# Patient Record
Sex: Male | Born: 1965 | Race: Black or African American | Hispanic: No | Smoking: Current every day smoker
Health system: Southern US, Community
[De-identification: ages and names within clinical notes are randomized; demographics above are authoritative.]

## PROBLEM LIST (undated history)

## (undated) DIAGNOSIS — E119 Type 2 diabetes mellitus without complications: Secondary | ICD-10-CM

## (undated) DIAGNOSIS — I82409 Acute embolism and thrombosis of unspecified deep veins of unspecified lower extremity: Secondary | ICD-10-CM

## (undated) DIAGNOSIS — T79A0XA Compartment syndrome, unspecified, initial encounter: Secondary | ICD-10-CM

## (undated) DIAGNOSIS — Z7901 Long term (current) use of anticoagulants: Secondary | ICD-10-CM

## (undated) HISTORY — PX: FASCIOTOMY: SHX132

---

## 2018-09-20 ENCOUNTER — Emergency Department (HOSPITAL_COMMUNITY)
Admission: EM | Admit: 2018-09-20 | Discharge: 2018-09-20 | Disposition: A | Discharge status: Home / Self Care | Payer: Self-pay | Attending: Emergency Medicine | Admitting: Emergency Medicine

## 2018-09-20 ENCOUNTER — Other Ambulatory Visit: Payer: Self-pay

## 2018-09-20 ENCOUNTER — Emergency Department (HOSPITAL_COMMUNITY): Payer: Self-pay

## 2018-09-20 ENCOUNTER — Encounter (HOSPITAL_COMMUNITY): Payer: Self-pay | Admitting: Emergency Medicine

## 2018-09-20 DIAGNOSIS — R739 Hyperglycemia, unspecified: Secondary | ICD-10-CM

## 2018-09-20 DIAGNOSIS — T79A0XA Compartment syndrome, unspecified, initial encounter: Secondary | ICD-10-CM | POA: Insufficient documentation

## 2018-09-20 DIAGNOSIS — Z794 Long term (current) use of insulin: Secondary | ICD-10-CM | POA: Insufficient documentation

## 2018-09-20 DIAGNOSIS — K449 Diaphragmatic hernia without obstruction or gangrene: Secondary | ICD-10-CM | POA: Insufficient documentation

## 2018-09-20 DIAGNOSIS — F17210 Nicotine dependence, cigarettes, uncomplicated: Secondary | ICD-10-CM | POA: Insufficient documentation

## 2018-09-20 DIAGNOSIS — Z7901 Long term (current) use of anticoagulants: Secondary | ICD-10-CM | POA: Insufficient documentation

## 2018-09-20 DIAGNOSIS — R0789 Other chest pain: Secondary | ICD-10-CM | POA: Insufficient documentation

## 2018-09-20 DIAGNOSIS — R791 Abnormal coagulation profile: Secondary | ICD-10-CM | POA: Insufficient documentation

## 2018-09-20 DIAGNOSIS — R112 Nausea with vomiting, unspecified: Secondary | ICD-10-CM | POA: Insufficient documentation

## 2018-09-20 DIAGNOSIS — E1165 Type 2 diabetes mellitus with hyperglycemia: Secondary | ICD-10-CM | POA: Insufficient documentation

## 2018-09-20 DIAGNOSIS — Z86718 Personal history of other venous thrombosis and embolism: Secondary | ICD-10-CM | POA: Insufficient documentation

## 2018-09-20 DIAGNOSIS — R10816 Epigastric abdominal tenderness: Secondary | ICD-10-CM | POA: Insufficient documentation

## 2018-09-20 HISTORY — DX: Long term (current) use of anticoagulants: Z79.01

## 2018-09-20 HISTORY — DX: Acute embolism and thrombosis of unspecified deep veins of unspecified lower extremity: I82.409

## 2018-09-20 HISTORY — DX: Compartment syndrome, unspecified, initial encounter: T79.A0XA

## 2018-09-20 HISTORY — DX: Type 2 diabetes mellitus without complications: E11.9

## 2018-09-20 LAB — CBC WITH DIFFERENTIAL/PLATELET
Abs Immature Granulocytes: 0.03 10*3/uL (ref 0.00–0.07)
Basophils Absolute: 0.1 10*3/uL (ref 0.0–0.1)
Basophils Relative: 0 %
EOS PCT: 1 %
Eosinophils Absolute: 0.1 10*3/uL (ref 0.0–0.5)
HCT: 44.7 % (ref 39.0–52.0)
Hemoglobin: 14.3 g/dL (ref 13.0–17.0)
Immature Granulocytes: 0 %
Lymphocytes Relative: 14 %
Lymphs Abs: 2 10*3/uL (ref 0.7–4.0)
MCH: 27.7 pg (ref 26.0–34.0)
MCHC: 32 g/dL (ref 30.0–36.0)
MCV: 86.6 fL (ref 80.0–100.0)
Monocytes Absolute: 0.8 10*3/uL (ref 0.1–1.0)
Monocytes Relative: 6 %
Neutro Abs: 11 10*3/uL — ABNORMAL HIGH (ref 1.7–7.7)
Neutrophils Relative %: 79 %
Platelets: 384 10*3/uL (ref 150–400)
RBC: 5.16 MIL/uL (ref 4.22–5.81)
RDW: 12.9 % (ref 11.5–15.5)
WBC: 14 10*3/uL — AB (ref 4.0–10.5)
nRBC: 0 % (ref 0.0–0.2)

## 2018-09-20 LAB — COMPREHENSIVE METABOLIC PANEL
ALK PHOS: 73 U/L (ref 38–126)
ALT: 14 U/L (ref 0–44)
AST: 12 U/L — ABNORMAL LOW (ref 15–41)
Albumin: 3.8 g/dL (ref 3.5–5.0)
Anion gap: 12 (ref 5–15)
BILIRUBIN TOTAL: 0.6 mg/dL (ref 0.3–1.2)
BUN: 17 mg/dL (ref 6–20)
CO2: 27 mmol/L (ref 22–32)
Calcium: 9.4 mg/dL (ref 8.9–10.3)
Chloride: 98 mmol/L (ref 98–111)
Creatinine, Ser: 0.92 mg/dL (ref 0.61–1.24)
GFR calc Af Amer: >60 mL/min (ref >60)
GFR calc non Af Amer: >60 mL/min (ref >60)
Glucose, Bld: 285 mg/dL — ABNORMAL HIGH (ref 70–99)
Potassium: 4.1 mmol/L (ref 3.5–5.1)
Sodium: 137 mmol/L (ref 135–145)
TOTAL PROTEIN: 7.1 g/dL (ref 6.5–8.1)

## 2018-09-20 LAB — PROTIME-INR
INR: 1
Prothrombin Time: 13.1 seconds (ref 11.4–15.2)

## 2018-09-20 LAB — LIPASE, BLOOD: Lipase: 30 U/L (ref 11–51)

## 2018-09-20 LAB — CBG MONITORING, ED: Glucose-Capillary: 248 mg/dL — ABNORMAL HIGH (ref 70–99)

## 2018-09-20 LAB — I-STAT TROPONIN, ED
Troponin i, poc: 0 ng/mL (ref 0.00–0.08)
Troponin i, poc: 0 ng/mL (ref 0.00–0.08)

## 2018-09-20 MED ORDER — WARFARIN SODIUM 6 MG PO TABS: 6.0000 mg | ORAL_TABLET | Freq: Every day | ORAL | 0 refills | Status: AC

## 2018-09-20 MED ORDER — ONDANSETRON HCL 4 MG/2ML IJ SOLN
4.0000 mg | Freq: Once | INTRAMUSCULAR | Status: AC
  Administered 2018-09-20: 4 mg via INTRAVENOUS
  Filled 2018-09-20: qty 2

## 2018-09-20 MED ORDER — KETOROLAC TROMETHAMINE 30 MG/ML IJ SOLN
30.0000 mg | Freq: Once | INTRAMUSCULAR | Status: AC
  Administered 2018-09-20: 30 mg via INTRAVENOUS
  Filled 2018-09-20: qty 1

## 2018-09-20 MED ORDER — OMEPRAZOLE 20 MG PO CPDR: 20.0000 mg | DELAYED_RELEASE_CAPSULE | Freq: Every day | ORAL | 0 refills | Status: AC

## 2018-09-20 MED ORDER — ONDANSETRON 4 MG PO TBDP: 4.0000 mg | ORAL_TABLET | Freq: Three times a day (TID) | ORAL | 0 refills | Status: DC | PRN

## 2018-09-20 MED ORDER — INSULIN DETEMIR 100 UNIT/ML FLEXPEN: 25.0000 [IU] | PEN_INJECTOR | Freq: Every day | SUBCUTANEOUS | 0 refills | Status: AC

## 2018-09-20 MED ORDER — IOPAMIDOL (ISOVUE-370) INJECTION 76%
INTRAVENOUS | Status: AC
  Administered 2018-09-20: 100 mL
  Filled 2018-09-20: qty 100

## 2018-09-20 NOTE — ED Notes (Signed)
Patient verbalizes understanding of discharge instructions. Opportunity for questioning and answers were provided. Armband removed by staff, pt discharged from ED by wheelchair   

## 2018-09-20 NOTE — ED Provider Notes (Signed)
[facility] EMERGENCY DEPARTMENT Provider Note   CSN: [id number] Arrival date & time: 09/20/18  1445     History   Chief Complaint Chief Complaint  Patient presents with  . Chest Pain    HPI Phillip Avila is a 53 y.o. 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  He reports that he has not been compliant with his warfarin and has missed his last 3 doses.  He reports that he has run out of his warfarin and his insulins.  He reports his chest pain is mid to substernal, worsens when he lays flat.  Whenever he lays flat the pain becomes so bad that he vomits.  He denies shortness of breath.  He reports that this started yesterday morning at around 9 AM.  He denies any abdominal pain.  No history of abdominal surgeries.  Heart review shows that he is previously been evaluated for intractable hiccups by ENT.  HPI  Past Medical History:  Diagnosis Date  . Chronic anticoagulation   . Compartment syndrome (HCC)    Right arm, nontraumatic  . Diabetes mellitus without complication (HCC)   . DVT (deep venous thrombosis) [facility])     Patient Active Problem List   Diagnosis Date Noted  . Compartment syndrome [facility])     Past Surgical History:  Procedure Laterality Date  . FASCIOTOMY          Home Medications    Prior to Admission medications   Medication Sig Start Date End Date Taking? Authorizing Provider  Insulin Detemir (LEVEMIR FLEXTOUCH) 100 UNIT/ML Pen Inject 25 Units into the skin at bedtime.   Yes [provider]  warfarin (COUMADIN) 6 MG tablet Take 6 mg by mouth at bedtime.   Yes [provider]    Family History History reviewed. No pertinent family history.  Social History Social History   Tobacco Use  . Smoking status: Current Every Day Smoker    Packs/day: 1.00   Types: Cigarettes  . Smokeless tobacco: Never Used  Substance Use Topics  . Alcohol use: Not on file  . Drug use: Not on file     Allergies   Vicodin [hydrocodone-acetaminophen]   Review of Systems Review of Systems  Constitutional: Negative for chills and fever.  Eyes: Negative for visual disturbance.  Respiratory: Positive for chest tightness and shortness of breath.   Cardiovascular: Positive for chest pain. Negative for palpitations and leg swelling.  Gastrointestinal: Positive for abdominal pain, nausea and vomiting. Negative for constipation and diarrhea.  All other systems reviewed and are negative.    Physical Exam Updated Vital Signs BP 135/82 (BP Location: Right Arm)   Pulse 84   Temp 99.1 F (37.3 C) (Oral)   Resp 19   Ht 5\' 10"  (1.778 m)   Wt 81.6 kg   SpO2 97%   BMI 25.83 kg/m   Physical Exam Vitals signs and nursing note reviewed.  Constitutional:      Appearance: He is well-developed.  HENT:     Head: Normocephalic and atraumatic.  Eyes:     Conjunctiva/sclera: Conjunctivae normal.  Neck:     Musculoskeletal: Neck supple.     Vascular: No JVD.     Trachea: No tracheal deviation.  Cardiovascular:     Rate and Rhythm: Normal rate and regular rhythm.     Pulses:  Dorsalis pedis pulses are 2+ on the right side and 2+ on the left side.       Posterior tibial pulses are 2+ on the right side and 2+ on the left side.     Heart sounds: Normal heart sounds. No murmur.  Pulmonary:     Effort: Pulmonary effort is normal. No respiratory distress.     Breath sounds: Normal breath sounds. No decreased breath sounds or wheezing.  Chest:     Chest wall: No mass, tenderness or edema.  Abdominal:     General: Bowel sounds are normal.     Palpations: Abdomen is soft. There is no mass.     Tenderness: There is abdominal tenderness (Epigastric).  Skin:    General: Skin is warm and dry.  Neurological:     General: No focal deficit present.     Mental  Status: He is alert.     Cranial Nerves: No cranial nerve deficit.     Motor: No weakness.  Psychiatric:        Mood and Affect: Mood normal.        Behavior: Behavior normal.      ED Treatments / Results  Labs (all labs ordered are listed, but only abnormal results are displayed) Labs Reviewed  COMPREHENSIVE METABOLIC PANEL - Abnormal; Notable for the following components:      Result Value   Glucose, Bld 285 (*)    AST 12 (*)    All other components within normal limits  CBC WITH DIFFERENTIAL/PLATELET - Abnormal; Notable for the following components:   WBC 14.0 (*)    Neutro Abs 11.0 (*)    All other components within normal limits  CBG MONITORING, ED - Abnormal; Notable for the following components:   Glucose-Capillary 248 (*)    All other components within normal limits  LIPASE, BLOOD  PROTIME-INR  I-STAT TROPONIN, ED  I-STAT TROPONIN, ED    EKG EKG Interpretation  Date/Time:  Saturday September 20 2018 15:44:18 EST Ventricular Rate:  81 PR Interval:    QRS Duration: 100 QT Interval:  364 QTC Calculation: 423 R Axis:   63 Text Interpretation:  Sinus rhythm ST elev, probable normal early repol pattern Baseline wander in lead(s) V1 No STEMI.  Confirmed by Alona Bene [phone]) on 09/20/2018 3:48:37 PM   Radiology Dg Chest 2 View  Result Date: 09/20/2018 CLINICAL DATA:  53 year old with chest pain and shortness of breath. EXAM: CHEST - 2 VIEW COMPARISON:  None. FINDINGS: Both lungs are clear. Negative for a pneumothorax. Heart and mediastinum are within normal limits. Trachea is midline. No pleural effusions. Bone structures are unremarkable. IMPRESSION: No active cardiopulmonary disease. Electronically Signed   By: Richarda Overlie M.D.   On: 09/20/2018 15:32   Ct Angio Chest Pe W/cm &/or Wo Cm  Result Date: 09/20/2018 CLINICAL DATA:  Chest pain and abdominal pain, initial encounter EXAM: CT ANGIOGRAPHY CHEST CT ABDOMEN AND PELVIS WITH CONTRAST TECHNIQUE: Multidetector CT  imaging of the chest was performed using the standard protocol during bolus administration of intravenous contrast. Multiplanar CT image reconstructions and MIPs were obtained to evaluate the vascular anatomy. Multidetector CT imaging of the abdomen and pelvis was performed using the standard protocol during bolus administration of intravenous contrast. Images in the upper abdomen are significantly limited due to patient motion artifact. CONTRAST:  ISOVUE-370 IOPAMIDOL (ISOVUE-370) INJECTION 76% COMPARISON:  Plain film from earlier in the same day. FINDINGS: CTA CHEST FINDINGS Cardiovascular: Thoracic aorta demonstrates mild atherosclerotic calcifications  without aneurysmal dilatation. No cardiac enlargement is seen. No pericardial effusion is noted. The pulmonary artery shows a normal branching pattern bilaterally no definitive filling defects are identified to suggest pulmonary embolism. Mediastinum/Nodes: Thoracic inlet is within normal limits. No sizable hilar or mediastinal adenopathy is noted. Large sliding-type hiatal hernia is noted with least 50% of the stomach within the chest. Lungs/Pleura: Lungs are clear. No pleural effusion or pneumothorax. Musculoskeletal: Mild degenerative changes of the thoracic spine are noted. No acute bony abnormality is seen. Review of the MIP images confirms the above findings. CT ABDOMEN and PELVIS FINDINGS Hepatobiliary: Gallbladder is predominately decompressed. Liver is within normal limits. Pancreas: Pancreas is within normal limits. Spleen: Normal in size without focal abnormality. Adrenals/Urinary Tract: Adrenal glands are within normal limits. Kidneys are well visualized bilaterally. Left renal cyst is noted. It measures approximately 1 cm. No obstructive changes are seen. Bladder is well distended. Stomach/Bowel: The appendix is within normal limits. The large and small bowel show no obstructive or inflammatory changes. Stomach again demonstrates a large hiatal  hernia. Vascular/Lymphatic: No significant vascular findings are present. No enlarged abdominal or pelvic lymph nodes. Reproductive: Prostate is unremarkable. Other: No abdominal wall hernia or abnormality. No abdominopelvic ascites. Musculoskeletal: No acute or significant osseous findings. Review of the MIP images confirms the above findings. IMPRESSION: CTA of the chest: No evidence of pulmonary embolism. No focal infiltrate is seen. CT of the abdomen and pelvis: Large hiatal hernia. Somewhat limited exam due to patient motion artifact. No focal acute abnormality is noted. Electronically Signed   By: Alcide CleverMark  Lukens M.D.   On: 09/20/2018 18:50   Ct Abdomen Pelvis W Contrast  Result Date: 09/20/2018 CLINICAL DATA:  Chest pain and abdominal pain, initial encounter EXAM: CT ANGIOGRAPHY CHEST CT ABDOMEN AND PELVIS WITH CONTRAST TECHNIQUE: Multidetector CT imaging of the chest was performed using the standard protocol during bolus administration of intravenous contrast. Multiplanar CT image reconstructions and MIPs were obtained to evaluate the vascular anatomy. Multidetector CT imaging of the abdomen and pelvis was performed using the standard protocol during bolus administration of intravenous contrast. Images in the upper abdomen are significantly limited due to patient motion artifact. CONTRAST:  100mL ISOVUE-370 IOPAMIDOL (ISOVUE-370) INJECTION 76% COMPARISON:  Plain film from earlier in the same day. FINDINGS: CTA CHEST FINDINGS Cardiovascular: Thoracic aorta demonstrates mild atherosclerotic calcifications without aneurysmal dilatation. No cardiac enlargement is seen. No pericardial effusion is noted. The pulmonary artery shows a normal branching pattern bilaterally no definitive filling defects are identified to suggest pulmonary embolism. Mediastinum/Nodes: Thoracic inlet is within normal limits. No sizable hilar or mediastinal adenopathy is noted. Large sliding-type hiatal hernia is noted with least 50% of  the stomach within the chest. Lungs/Pleura: Lungs are clear. No pleural effusion or pneumothorax. Musculoskeletal: Mild degenerative changes of the thoracic spine are noted. No acute bony abnormality is seen. Review of the MIP images confirms the above findings. CT ABDOMEN and PELVIS FINDINGS Hepatobiliary: Gallbladder is predominately decompressed. Liver is within normal limits. Pancreas: Pancreas is within normal limits. Spleen: Normal in size without focal abnormality. Adrenals/Urinary Tract: Adrenal glands are within normal limits. Kidneys are well visualized bilaterally. Left renal cyst is noted. It measures approximately 1 cm. No obstructive changes are seen. Bladder is well distended. Stomach/Bowel: The appendix is within normal limits. The large and small bowel show no obstructive or inflammatory changes. Stomach again demonstrates a large hiatal hernia. Vascular/Lymphatic: No significant vascular findings are present. No enlarged abdominal or pelvic lymph nodes. Reproductive:  Prostate is unremarkable. Other: No abdominal wall hernia or abnormality. No abdominopelvic ascites. Musculoskeletal: No acute or significant osseous findings. Review of the MIP images confirms the above findings. IMPRESSION: CTA of the chest: No evidence of pulmonary embolism. No focal infiltrate is seen. CT of the abdomen and pelvis: Large hiatal hernia. Somewhat limited exam due to patient motion artifact. No focal acute abnormality is noted. Electronically Signed   By: Alcide CleverMark  Lukens M.D.   On: 09/20/2018 18:50    Procedures Procedures (including critical care time)  Medications Ordered in ED Medications  iopamidol (ISOVUE-370) 76 % injection (100 mLs  Contrast Given 09/20/18 1745)  ketorolac (TORADOL) 30 MG/ML injection 30 mg (30 mg Intravenous Given 09/20/18 2003)  ondansetron (ZOFRAN) injection 4 mg (4 mg Intravenous Given 09/20/18 2014)     Initial Impression / Assessment and Plan / ED Course  I have reviewed the  triage vital signs and the nursing notes.  Pertinent labs & imaging results that were available during my care of the patient were reviewed by me and considered in my medical decision making (see chart for details).  Clinical Course as of Sep 20 2150  Sat Sep 20, 2018  2101 Patient reevaluated, he reports hiccups.  Is pending delta troponin.   [EH]  2144 Patient reevaluated, he reports his hiccups are gone.  He is playing games on his phone in no distress.  Says he is ready to go.   [EH]    Clinical Course User Index [EH] Cristina GongHammond, Macall Mccroskey W, PA-C   Patient presents today for evaluation of chest pain, abdominal pain and nausea with vomiting.  Troponin x2 normal.  Chest x-ray normal.  He has a history of chronic upper extremity DVT and has not been compliant with his anticoagulation.  Warfarin level is subtherapeutic.  He had epigastric tenderness to palpation on exam.  Given combination CT angios PE study along with CT abdomen pelvis with contrast were obtained showing no evidence of PE.  He does have a large hiatal hernia without other abnormalities which I suspect is the cause of his symptoms.  He was treated in the emergency room with Toradol, and Zofran.  He was p.o. challenged.  We discussed conservative measures including eating small frequent meals and drinking small sips of fluid.  He is out of his insulin and warfarin, given refill on both.  He was reevaluated in the emergency room after multiple hours was in no distress, playing games on his phone, and states he is ready to go home.  Return precautions were discussed with patient who states their understanding.  At the time of discharge patient denied any unaddressed complaints or concerns.  Patient is agreeable for discharge home.   Final Clinical Impressions(s) / ED Diagnoses   Final diagnoses:  Atypical chest pain  Hiatal hernia    ED Discharge Orders    None       Norman ClayHammond, Shonette Rhames W, PA-C 09/20/18 2252    Maia PlanLong,  Joshua G, MD 09/20/18 2355

## 2018-09-20 NOTE — Discharge Instructions (Signed)
Please make sure that you drink plenty of water to help your kidneys flush the contrast out.  If you take metformin, please do not take it for the next 3 days.  Your sugars were high in the department, please make sure that you are taking your insulin.  Your INR was low today, you need to be taking your warfarin or you may develop serious complications.

## 2018-09-20 NOTE — ED Triage Notes (Signed)
Pt coming of chest pain that goes from the right side to left intermittently. Experiences N/V and lightheadedness with it. Flare up has been occurring since last night around 9 pm

## 2018-09-20 NOTE — ED Notes (Signed)
Unsuccessful IV attempt x2.  

## 2019-02-10 ENCOUNTER — Encounter (HOSPITAL_COMMUNITY): Payer: Self-pay

## 2019-02-10 ENCOUNTER — Emergency Department (HOSPITAL_COMMUNITY)
Admission: EM | Admit: 2019-02-10 | Discharge: 2019-02-10 | Disposition: A | Discharge status: Home / Self Care | Payer: Self-pay | Attending: Emergency Medicine | Admitting: Emergency Medicine

## 2019-02-10 ENCOUNTER — Emergency Department (HOSPITAL_BASED_OUTPATIENT_CLINIC_OR_DEPARTMENT_OTHER)
Admission: EM | Admit: 2019-02-10 | Discharge: 2019-02-10 | Disposition: A | Discharge status: Home / Self Care | Payer: Self-pay | Source: Home / Self Care

## 2019-02-10 DIAGNOSIS — Z7901 Long term (current) use of anticoagulants: Secondary | ICD-10-CM | POA: Insufficient documentation

## 2019-02-10 DIAGNOSIS — M79602 Pain in left arm: Secondary | ICD-10-CM | POA: Insufficient documentation

## 2019-02-10 DIAGNOSIS — I82409 Acute embolism and thrombosis of unspecified deep veins of unspecified lower extremity: Secondary | ICD-10-CM

## 2019-02-10 DIAGNOSIS — F17210 Nicotine dependence, cigarettes, uncomplicated: Secondary | ICD-10-CM | POA: Insufficient documentation

## 2019-02-10 DIAGNOSIS — Z86718 Personal history of other venous thrombosis and embolism: Secondary | ICD-10-CM | POA: Insufficient documentation

## 2019-02-10 DIAGNOSIS — E119 Type 2 diabetes mellitus without complications: Secondary | ICD-10-CM | POA: Insufficient documentation

## 2019-02-10 LAB — PROTIME-INR
INR: 1.1 (ref 0.8–1.2)
Prothrombin Time: 13.8 seconds (ref 11.4–15.2)

## 2019-02-10 LAB — CBC WITH DIFFERENTIAL/PLATELET
Abs Immature Granulocytes: 0.01 10*3/uL (ref 0.00–0.07)
Basophils Absolute: 0 10*3/uL (ref 0.0–0.1)
Basophils Relative: 1 %
Eosinophils Absolute: 0.2 10*3/uL (ref 0.0–0.5)
Eosinophils Relative: 3 %
HCT: 39.8 % (ref 39.0–52.0)
Hemoglobin: 12.2 g/dL — ABNORMAL LOW (ref 13.0–17.0)
Immature Granulocytes: 0 %
Lymphocytes Relative: 28 %
Lymphs Abs: 1.9 10*3/uL (ref 0.7–4.0)
MCH: 27.2 pg (ref 26.0–34.0)
MCHC: 30.7 g/dL (ref 30.0–36.0)
MCV: 88.8 fL (ref 80.0–100.0)
Monocytes Absolute: 0.5 10*3/uL (ref 0.1–1.0)
Monocytes Relative: 8 %
Neutro Abs: 4.1 10*3/uL (ref 1.7–7.7)
Neutrophils Relative %: 60 %
Platelets: 383 10*3/uL (ref 150–400)
RBC: 4.48 MIL/uL (ref 4.22–5.81)
RDW: 15.9 % — ABNORMAL HIGH (ref 11.5–15.5)
WBC: 6.8 10*3/uL (ref 4.0–10.5)
nRBC: 0 % (ref 0.0–0.2)

## 2019-02-10 LAB — COMPREHENSIVE METABOLIC PANEL
ALT: 19 U/L (ref 0–44)
AST: 15 U/L (ref 15–41)
Albumin: 3.4 g/dL — ABNORMAL LOW (ref 3.5–5.0)
Alkaline Phosphatase: 56 U/L (ref 38–126)
Anion gap: 6 (ref 5–15)
BUN: 10 mg/dL (ref 6–20)
CO2: 26 mmol/L (ref 22–32)
Calcium: 9 mg/dL (ref 8.9–10.3)
Chloride: 108 mmol/L (ref 98–111)
Creatinine, Ser: 0.81 mg/dL (ref 0.61–1.24)
GFR calc Af Amer: >60 mL/min (ref >60)
GFR calc non Af Amer: >60 mL/min (ref >60)
Glucose, Bld: 194 mg/dL — ABNORMAL HIGH (ref 70–99)
Potassium: 4.7 mmol/L (ref 3.5–5.1)
Sodium: 140 mmol/L (ref 135–145)
Total Bilirubin: 0.3 mg/dL (ref 0.3–1.2)
Total Protein: 6.1 g/dL — ABNORMAL LOW (ref 6.5–8.1)

## 2019-02-10 NOTE — ED Provider Notes (Signed)
[facility]   CSN: [id number] Arrival date & time: 02/10/19  0715    History   Chief Complaint Chief Complaint  Patient presents with   Arm Pain    HPI Phillip Avila is a 53 y.o. male.     The history is provided by the patient. No language interpreter was used.  Arm Pain  This is a new problem. The current episode started yesterday. The problem occurs constantly. The problem has been rapidly worsening. Nothing aggravates the symptoms. Nothing relieves the symptoms. He has tried nothing for the symptoms. The treatment provided no relief.  Pt complains of left arm pain.  Pt concerned that he could have a dvt.  Pt reports he has had one previously.  Pt is on coumadin   Past Medical History:  Diagnosis Date   Chronic anticoagulation    Compartment syndrome (HCC)    Right arm, nontraumatic   Diabetes mellitus without complication (HCC)    DVT (deep venous thrombosis) [facility](HCC)     Patient Active Problem List   Diagnosis Date Noted   Compartment syndrome [facility](HCC)     Past Surgical History:  Procedure Laterality Date   FASCIOTOMY          Home Medications    Prior to Admission medications   Medication Sig Start Date End Date Taking? Authorizing Provider  Insulin Detemir (LEVEMIR FLEXTOUCH) 100 UNIT/ML Pen Inject 25 Units into the skin at bedtime. 09/20/18   Cristina GongHammond, Elizabeth W, PA-C  omeprazole (PRILOSEC) 20 MG capsule Take 1 capsule (20 mg total) by mouth daily for 30 days. 09/20/18 10/20/18  Cristina GongHammond, Elizabeth W, PA-C  ondansetron (ZOFRAN ODT) 4 MG disintegrating tablet Take 1 tablet (4 mg total) by mouth every 8 (eight) hours as needed for nausea or vomiting. 09/20/18   Cristina GongHammond, Elizabeth W, PA-C  warfarin (COUMADIN) 6 MG tablet Take 1 tablet (6 mg total) by mouth at bedtime for 7 days. 09/20/18 09/27/18  Cristina GongHammond, Elizabeth W, PA-C    Family History History reviewed. No pertinent family history.  Social  History Social History   Tobacco Use   Smoking status: Current Every Day Smoker    Packs/day: 1.00    Types: Cigarettes   Smokeless tobacco: Never Used  Substance Use Topics   Alcohol use: Not on file   Drug use: Not on file     Allergies   Vicodin [hydrocodone-acetaminophen]   Review of Systems Review of Systems  All other systems reviewed and are negative.    Physical Exam Updated Vital Signs BP (!) 158/86    Pulse 79    Temp 98.2 F (36.8 C) (Oral)    Resp 18    SpO2 99%   Physical Exam Vitals signs and nursing note reviewed.  Constitutional:      Appearance: He is well-developed.  HENT:     Head: Normocephalic and atraumatic.  Eyes:     Conjunctiva/sclera: Conjunctivae normal.  Neck:     Musculoskeletal: Neck supple.  Cardiovascular:     Rate and Rhythm: Normal rate and regular rhythm.     Heart sounds: No murmur.  Pulmonary:     Effort: Pulmonary effort is normal. No respiratory distress.     Breath sounds: Normal breath sounds.  Abdominal:     Palpations: Abdomen is soft.     Tenderness: There is no abdominal tenderness.  Musculoskeletal:        General: Tenderness present. No swelling.  Comments: Tender left lower arm.    Skin:    General: Skin is warm and dry.  Neurological:     General: No focal deficit present.     Mental Status: He is alert.  Psychiatric:        Mood and Affect: Mood normal.      ED Treatments / Results  Labs (all labs ordered are listed, but only abnormal results are displayed) Labs Reviewed  CBC WITH DIFFERENTIAL/PLATELET - Abnormal; Notable for the following components:      Result Value   Hemoglobin 12.2 (*)    RDW 15.9 (*)    All other components within normal limits  COMPREHENSIVE METABOLIC PANEL - Abnormal; Notable for the following components:   Glucose, Bld 194 (*)    Total Protein 6.1 (*)    Albumin 3.4 (*)    All other components within normal limits  PROTIME-INR    EKG None  Radiology Ue  Venous Duplex ([facility] Only)  Result Date: 02/10/2019 UPPER VENOUS STUDY  Indications: Pain, and Hx DVT Comparison Study: no prior at this facility Performing Technologist: June Leap RDMS, RVT  Examination Guidelines: A complete evaluation includes B-mode imaging, spectral Doppler, color Doppler, and power Doppler as needed of all accessible portions of each vessel. Bilateral testing is considered an integral part of a complete examination. Limited examinations for reoccurring indications may be performed as noted.  Right Findings: +----------+------------+---------+-----------+----------+-------+  RIGHT      Compressible Phasicity Spontaneous Properties Summary  +----------+------------+---------+-----------+----------+-------+  Subclavian                 Yes        Yes                         +----------+------------+---------+-----------+----------+-------+  Left Findings: +----------+------------+---------+-----------+----------+-------+  LEFT       Compressible Phasicity Spontaneous Properties Summary  +----------+------------+---------+-----------+----------+-------+  IJV            Full        Yes        Yes                         +----------+------------+---------+-----------+----------+-------+  Subclavian     Full        Yes        Yes                         +----------+------------+---------+-----------+----------+-------+  Axillary       Full        Yes        Yes                         +----------+------------+---------+-----------+----------+-------+  Brachial       Full        Yes        Yes                         +----------+------------+---------+-----------+----------+-------+  Radial         Full                                               +----------+------------+---------+-----------+----------+-------+  Ulnar  Full                                               +----------+------------+---------+-----------+----------+-------+  Cephalic       Full                                                +----------+------------+---------+-----------+----------+-------+  Basilic        Full                                               +----------+------------+---------+-----------+----------+-------+  Summary:  Right: No evidence of thrombosis in the subclavian.  Left: No evidence of deep vein thrombosis in the upper extremity. No evidence of superficial vein thrombosis in the upper extremity.  *See table(s) above for measurements and observations.  Diagnosing physician: Coral ElseVance Brabham MD Electronically signed by Coral ElseVance Brabham MD on 02/10/2019 at 12:45:42 PM.    Final     Procedures Procedures (including critical care time)  Medications Ordered in ED Medications - No data to display   Initial Impression / Assessment and Plan / ED Course  I have reviewed the triage vital signs and the nursing notes.  Pertinent labs & imaging results that were available during my care of the patient were reviewed by me and considered in my medical decision making (see chart for details).        Pt advised to take his coumadin.  Pt denies missing any dosages,  He takes 6mg  a day  Pt advised to followup as scheduled with his MD  Final Clinical Impressions(s) / ED Diagnoses   Final diagnoses:  Left arm pain   An After Visit Summary was printed and given to the patient.  ED Discharge Orders    None       Osie CheeksSofia, Syla Devoss K, PA-C 02/10/19 1516    Azalia Bilisampos, Kevin, MD 02/13/19 1340

## 2019-02-10 NOTE — ED Notes (Signed)
Patient verbalizes understanding of discharge instructions. Opportunity for questioning and answering were provided.  patient discharged from ED.  

## 2019-02-10 NOTE — Discharge Instructions (Addendum)
Tylenol for pain.  Take your coumadin as directed

## 2019-02-10 NOTE — ED Triage Notes (Signed)
Pt endorses left forearm pain x 2 days. Has hx of blood clots in the right arm and wants to make sure this is not a blood cloot. Denies cp, shob, dizziness or any cardiac sx. VSS

## 2019-02-10 NOTE — Progress Notes (Signed)
LUE venous duplex       has been completed. Preliminary results can be found under CV proc through chart review. Davionne Dowty, BS, RDMS, RVT   

## 2019-03-20 ENCOUNTER — Other Ambulatory Visit: Payer: Self-pay

## 2019-03-20 ENCOUNTER — Emergency Department (HOSPITAL_COMMUNITY)
Admission: EM | Admit: 2019-03-20 | Discharge: 2019-03-20 | Disposition: A | Discharge status: Home / Self Care | Payer: Self-pay | Attending: Emergency Medicine | Admitting: Emergency Medicine

## 2019-03-20 ENCOUNTER — Emergency Department (HOSPITAL_COMMUNITY): Payer: Self-pay

## 2019-03-20 ENCOUNTER — Encounter (HOSPITAL_COMMUNITY): Payer: Self-pay | Admitting: Emergency Medicine

## 2019-03-20 DIAGNOSIS — E119 Type 2 diabetes mellitus without complications: Secondary | ICD-10-CM | POA: Insufficient documentation

## 2019-03-20 DIAGNOSIS — Z794 Long term (current) use of insulin: Secondary | ICD-10-CM | POA: Insufficient documentation

## 2019-03-20 DIAGNOSIS — R066 Hiccough: Secondary | ICD-10-CM | POA: Insufficient documentation

## 2019-03-20 DIAGNOSIS — F17210 Nicotine dependence, cigarettes, uncomplicated: Secondary | ICD-10-CM | POA: Insufficient documentation

## 2019-03-20 LAB — COMPREHENSIVE METABOLIC PANEL
ALT: 12 U/L (ref 0–44)
AST: 12 U/L — ABNORMAL LOW (ref 15–41)
Albumin: 4.3 g/dL (ref 3.5–5.0)
Alkaline Phosphatase: 58 U/L (ref 38–126)
Anion gap: 13 (ref 5–15)
BUN: 20 mg/dL (ref 6–20)
CO2: 29 mmol/L (ref 22–32)
Calcium: 9.9 mg/dL (ref 8.9–10.3)
Chloride: 94 mmol/L — ABNORMAL LOW (ref 98–111)
Creatinine, Ser: 1.11 mg/dL (ref 0.61–1.24)
GFR calc Af Amer: >60 mL/min (ref >60)
GFR calc non Af Amer: >60 mL/min (ref >60)
Glucose, Bld: 292 mg/dL — ABNORMAL HIGH (ref 70–99)
Potassium: 3.7 mmol/L (ref 3.5–5.1)
Sodium: 136 mmol/L (ref 135–145)
Total Bilirubin: 0.4 mg/dL (ref 0.3–1.2)
Total Protein: 7.5 g/dL (ref 6.5–8.1)

## 2019-03-20 LAB — CBC
HCT: 43.5 % (ref 39.0–52.0)
Hemoglobin: 14.1 g/dL (ref 13.0–17.0)
MCH: 27.4 pg (ref 26.0–34.0)
MCHC: 32.4 g/dL (ref 30.0–36.0)
MCV: 84.5 fL (ref 80.0–100.0)
Platelets: 324 10*3/uL (ref 150–400)
RBC: 5.15 MIL/uL (ref 4.22–5.81)
RDW: 13.6 % (ref 11.5–15.5)
WBC: 16.6 10*3/uL — ABNORMAL HIGH (ref 4.0–10.5)
nRBC: 0 % (ref 0.0–0.2)

## 2019-03-20 LAB — TROPONIN I (HIGH SENSITIVITY)
Troponin I (High Sensitivity): 8 ng/L (ref <18)
Troponin I (High Sensitivity): 7 ng/L (ref <18)

## 2019-03-20 LAB — LIPASE, BLOOD: Lipase: 44 U/L (ref 11–51)

## 2019-03-20 MED ORDER — CHLORPROMAZINE HCL 25 MG PO TABS
25.0000 mg | ORAL_TABLET | Freq: Once | ORAL | Status: AC
  Administered 2019-03-20: 12:00:00 25 mg via ORAL
  Filled 2019-03-20: qty 1

## 2019-03-20 MED ORDER — SODIUM CHLORIDE 0.9 % IV BOLUS (SEPSIS)
1000.0000 mL | Freq: Once | INTRAVENOUS | Status: AC
  Administered 2019-03-20: 1000 mL via INTRAVENOUS

## 2019-03-20 MED ORDER — ONDANSETRON HCL 4 MG/2ML IJ SOLN
4.0000 mg | Freq: Once | INTRAMUSCULAR | Status: AC
  Administered 2019-03-20: 4 mg via INTRAVENOUS
  Filled 2019-03-20: qty 2

## 2019-03-20 MED ORDER — ONDANSETRON 4 MG PO TBDP: ORAL_TABLET | ORAL | 0 refills | Status: AC

## 2019-03-20 MED ORDER — CHLORPROMAZINE HCL 25 MG PO TABS: 25.0000 mg | ORAL_TABLET | Freq: Three times a day (TID) | ORAL | 0 refills | Status: AC | PRN

## 2019-03-20 MED ORDER — PANTOPRAZOLE SODIUM 40 MG IV SOLR
40.0000 mg | Freq: Once | INTRAVENOUS | Status: AC
  Administered 2019-03-20: 40 mg via INTRAVENOUS
  Filled 2019-03-20: qty 40

## 2019-03-20 MED ORDER — SODIUM CHLORIDE 0.9% FLUSH
3.0000 mL | Freq: Once | INTRAVENOUS | Status: AC
  Administered 2019-03-20: 3 mL via INTRAVENOUS

## 2019-03-20 MED ORDER — CHLORPROMAZINE HCL 25 MG PO TABS
25.0000 mg | ORAL_TABLET | Freq: Once | ORAL | Status: AC
  Administered 2019-03-20: 25 mg via ORAL
  Filled 2019-03-20: qty 1

## 2019-03-20 NOTE — ED Notes (Signed)
Tried to get patient blood on Rt. AC, and Rt. Hand, but I didn't have any success

## 2019-03-20 NOTE — Discharge Instructions (Addendum)
Go back to taking your Prilosec every day and follow-up with low-power gastroenterology or your family doctor if needed

## 2019-03-20 NOTE — ED Triage Notes (Signed)
Pt here for eval of chest pain, abdominal pain, generalized, and emesis since yesterday. Endorses chills/fever. Pt has hiccups presently.

## 2019-03-20 NOTE — ED Provider Notes (Signed)
[facility] Note   CSN: [id number] Arrival date & time: 03/20/19  1031     History   Chief Complaint Chief Complaint  Patient presents with  . Emesis  . Abdominal Pain  . Chest Pain    HPI Phillip Avila is a 53 y.o. male.     Patient complains of hiccups and some abdominal discomfort.  He recently stopped taking his Prilosec  The history is provided by the patient. No language interpreter was used.  Emesis Severity:  Moderate Timing:  Intermittent Quality:  Bilious material Able to tolerate:  Liquids Progression:  Unchanged Chronicity:  New Recent urination:  Normal Relieved by:  Nothing Worsened by:  Nothing Ineffective treatments:  None tried Associated symptoms: abdominal pain   Associated symptoms: no cough, no diarrhea and no headaches   Abdominal Pain Associated symptoms: chest pain and vomiting   Associated symptoms: no cough, no diarrhea, no fatigue and no hematuria   Chest Pain Associated symptoms: abdominal pain and vomiting   Associated symptoms: no back pain, no cough, no fatigue and no headache     Past Medical History:  Diagnosis Date  . Chronic anticoagulation   . Compartment syndrome (HCC)    Right arm, nontraumatic  . Diabetes mellitus without complication (HCC)   . DVT (deep venous thrombosis) [facility](HCC)     Patient Active Problem List   Diagnosis Date Noted  . Compartment syndrome [facility](HCC)     Past Surgical History:  Procedure Laterality Date  . FASCIOTOMY          Home Medications    Prior to Admission medications   Medication Sig Start Date End Date Taking? Authorizing Provider  Insulin Detemir (LEVEMIR FLEXTOUCH) 100 UNIT/ML Pen Inject 25 Units into the skin at bedtime. 09/20/18  Yes Cristina GongHammond, Elizabeth W, PA-C  omeprazole (PRILOSEC) 20 MG capsule Take 1 capsule (20 mg total) by mouth daily for 30 days. 09/20/18 03/20/19 Yes Cristina GongHammond, Elizabeth W, PA-C  chlorproMAZINE (THORAZINE) 25  MG tablet Take 1 tablet (25 mg total) by mouth 3 (three) times daily as needed for hiccoughs. 03/20/19   Bethann BerkshireZammit, Kaisha Wachob, MD  ondansetron (ZOFRAN ODT) 4 MG disintegrating tablet 4mg  ODT q4 hours prn nausea/vomit 03/20/19   Bethann BerkshireZammit, Daysha Ashmore, MD  warfarin (COUMADIN) 6 MG tablet Take 1 tablet (6 mg total) by mouth at bedtime for 7 days. 09/20/18 09/27/18  Cristina GongHammond, Elizabeth W, PA-C    Family History No family history on file.  Social History Social History   Tobacco Use  . Smoking status: Current Every Day Smoker    Packs/day: 1.00    Types: Cigarettes  . Smokeless tobacco: Never Used  Substance Use Topics  . Alcohol use: Not on file  . Drug use: Not on file     Allergies   Vicodin [hydrocodone-acetaminophen]   Review of Systems Review of Systems  Constitutional: Negative for appetite change and fatigue.  HENT: Negative for congestion, ear discharge and sinus pressure.   Eyes: Negative for discharge.  Respiratory: Negative for cough.   Cardiovascular: Positive for chest pain.  Gastrointestinal: Positive for abdominal pain and vomiting. Negative for diarrhea.  Genitourinary: Negative for frequency and hematuria.  Musculoskeletal: Negative for back pain.  Skin: Negative for rash.  Neurological: Negative for seizures and headaches.  Psychiatric/Behavioral: Negative for hallucinations.     Physical Exam Updated Vital Signs BP (!) 175/98   Pulse 79   Temp 98.7 F (37.1 C) (Oral)   Resp 18  Ht 5\' 10"  (1.778 m)   Wt 72.6 kg   SpO2 97%   BMI 22.96 kg/m   Physical Exam Vitals signs and nursing note reviewed.  Constitutional:      Appearance: He is well-developed.  HENT:     Head: Normocephalic.     Nose: Nose normal.  Eyes:     General: No scleral icterus.    Conjunctiva/sclera: Conjunctivae normal.  Neck:     Musculoskeletal: Neck supple.     Thyroid: No thyromegaly.  Cardiovascular:     Rate and Rhythm: Normal rate and regular rhythm.     Heart sounds: No murmur.  No friction rub. No gallop.   Pulmonary:     Breath sounds: No stridor. No wheezing or rales.  Chest:     Chest wall: No tenderness.  Abdominal:     General: There is no distension.     Tenderness: There is no rebound.     Comments: Mild discomfort on palpation  Musculoskeletal: Normal range of motion.  Lymphadenopathy:     Cervical: No cervical adenopathy.  Skin:    Findings: No erythema or rash.  Neurological:     Mental Status: He is alert and oriented to person, place, and time.     Motor: No abnormal muscle tone.     Coordination: Coordination normal.  Psychiatric:        Behavior: Behavior normal.      ED Treatments / Results  Labs (all labs ordered are listed, but only abnormal results are displayed) Labs Reviewed  CBC - Abnormal; Notable for the following components:      Result Value   WBC 16.6 (*)    All other components within normal limits  COMPREHENSIVE METABOLIC PANEL - Abnormal; Notable for the following components:   Chloride 94 (*)    Glucose, Bld 292 (*)    AST 12 (*)    All other components within normal limits  LIPASE, BLOOD  TROPONIN I (HIGH SENSITIVITY)  TROPONIN I (HIGH SENSITIVITY)    EKG None  Radiology Dg Abd Acute 2+v W 1v Chest  Result Date: 03/20/2019 CLINICAL DATA:  Chest pain and abdominal pain. Emesis beginning yesterday. Chills and fevers. EXAM: DG ABDOMEN ACUTE W/ 1V CHEST COMPARISON:  CT of the chest, abdomen, and pelvis 09/20/2018 FINDINGS: There is no evidence of dilated bowel loops or free intraperitoneal air. No radiopaque calculi or other significant radiographic abnormality is seen. Heart size and mediastinal contours are within normal limits. Both lungs are clear. IMPRESSION: Negative abdominal radiographs.  No acute cardiopulmonary disease. Electronically Signed   By: Marin Robertshristopher  Mattern M.D.   On: 03/20/2019 12:32    Procedures Procedures (including critical care time)  Medications Ordered in ED Medications  sodium  chloride flush (NS) 0.9 % injection 3 mL (3 mLs Intravenous Given 03/20/19 1150)  sodium chloride 0.9 % bolus 1,000 mL (0 mLs Intravenous Stopped 03/20/19 1302)  ondansetron (ZOFRAN) injection 4 mg (4 mg Intravenous Given 03/20/19 1149)  chlorproMAZINE (THORAZINE) tablet 25 mg (25 mg Oral Given 03/20/19 1151)  pantoprazole (PROTONIX) injection 40 mg (40 mg Intravenous Given 03/20/19 1417)  chlorproMAZINE (THORAZINE) tablet 25 mg (25 mg Oral Given 03/20/19 1417)     Initial Impression / Assessment and Plan / ED Course  I have reviewed the triage vital signs and the nursing notes.  Pertinent labs & imaging results that were available during my care of the patient were reviewed by me and considered in my medical decision making (see  chart for details).     Labs unremarkable except for mildly elevated white count.  Patient improved with Thorazine and Protonix.  Suspect gastritis from stopping his Prilosec.  Patient will begin back on his Prilosec take Zofran and Thorazine for the hiccups and follow-up with GI or family doctor   Final Clinical Impressions(s) / ED Diagnoses   Final diagnoses:  Intractable hiccups    ED Discharge Orders         Ordered    ondansetron (ZOFRAN ODT) 4 MG disintegrating tablet     03/20/19 1625    chlorproMAZINE (THORAZINE) 25 MG tablet  3 times daily PRN     03/20/19 1625           Milton Ferguson, MD 03/20/19 1631

## 2019-04-14 ENCOUNTER — Emergency Department (HOSPITAL_COMMUNITY)
Admission: EM | Admit: 2019-04-14 | Discharge: 2019-04-15 | Disposition: A | Discharge status: Home / Self Care | Payer: Medicaid Other | Attending: Emergency Medicine | Admitting: Emergency Medicine

## 2019-04-14 ENCOUNTER — Encounter (HOSPITAL_COMMUNITY): Payer: Self-pay | Admitting: Emergency Medicine

## 2019-04-14 ENCOUNTER — Other Ambulatory Visit: Payer: Self-pay

## 2019-04-14 DIAGNOSIS — E119 Type 2 diabetes mellitus without complications: Secondary | ICD-10-CM | POA: Insufficient documentation

## 2019-04-14 DIAGNOSIS — L509 Urticaria, unspecified: Secondary | ICD-10-CM | POA: Insufficient documentation

## 2019-04-14 DIAGNOSIS — F17210 Nicotine dependence, cigarettes, uncomplicated: Secondary | ICD-10-CM | POA: Insufficient documentation

## 2019-04-14 DIAGNOSIS — Z794 Long term (current) use of insulin: Secondary | ICD-10-CM | POA: Insufficient documentation

## 2019-04-15 MED ORDER — DIPHENHYDRAMINE HCL 25 MG PO CAPS
50.0000 mg | ORAL_CAPSULE | Freq: Once | ORAL | Status: AC
  Administered 2019-04-15: 01:00:00 50 mg via ORAL
  Filled 2019-04-15: qty 2

## 2019-04-15 MED ORDER — FAMOTIDINE 20 MG PO TABS
20.0000 mg | ORAL_TABLET | Freq: Once | ORAL | Status: AC
  Administered 2019-04-15: 20 mg via ORAL
  Filled 2019-04-15: qty 1

## 2019-04-15 MED ORDER — HYDROXYZINE HCL 25 MG PO TABS: 25.0000 mg | ORAL_TABLET | Freq: Four times a day (QID) | ORAL | 0 refills | Status: AC

## 2019-04-15 MED ORDER — FAMOTIDINE 20 MG PO TABS: 20.0000 mg | ORAL_TABLET | Freq: Two times a day (BID) | ORAL | 0 refills | Status: DC

## 2019-04-15 MED ORDER — HYDROXYZINE HCL 25 MG PO TABS
25.0000 mg | ORAL_TABLET | Freq: Once | ORAL | Status: AC
  Administered 2019-04-15: 25 mg via ORAL
  Filled 2019-04-15: qty 1

## 2019-04-15 MED ORDER — EPINEPHRINE 0.3 MG/0.3ML IJ SOAJ: 0.3000 mg | Freq: Once | INTRAMUSCULAR | Status: DC

## 2019-04-15 MED ORDER — DEXAMETHASONE 4 MG PO TABS
10.0000 mg | ORAL_TABLET | Freq: Once | ORAL | Status: AC
  Administered 2019-04-15: 10 mg via ORAL
  Filled 2019-04-15: qty 2

## 2019-04-15 NOTE — ED Notes (Addendum)
Patient was verbalized discharge instructions. Pt had no further questions at this time. NAD. 

## 2019-04-15 NOTE — ED Notes (Signed)
Pt ambulated to the bathroom without assistance. 

## 2019-04-15 NOTE — ED Provider Notes (Addendum)
[facility] DEPT Provider Note   CSN: [id number] Arrival date & time: 04/14/19  2225     History   Chief Complaint Chief Complaint  Patient presents with  . Allergic Reaction    HPI Phillip Avila is a 53 y.o. male.     Patient to ED with complaint of rash that started around 2 hours ago. It is generalized with palms and soles spared. No SOB, lip/tonuge swelling or difficulty swallowing. He has some prescription skin cream that he tried without relief. He does not know what kind of cream it was or what it was given to him for to treat. He states the rash itches intensely. No nausea, vomiting, cough, congestion. No new medications, new accommodations.  The history is provided by the patient. No language interpreter was used.    Past Medical History:  Diagnosis Date  . Chronic anticoagulation   . Compartment syndrome (HCC)    Right arm, nontraumatic  . Diabetes mellitus without complication ([place])   . DVT (deep venous thrombosis) [facility])     Patient Active Problem List   Diagnosis Date Noted  . Compartment syndrome [facility])     Past Surgical History:  Procedure Laterality Date  . FASCIOTOMY          Home Medications    Prior to Admission medications   Medication Sig Start Date End Date Taking? Authorizing Provider  chlorproMAZINE (THORAZINE) 25 MG tablet Take 1 tablet (25 mg total) by mouth 3 (three) times daily as needed for hiccoughs. 03/20/19   Milton Ferguson, MD  Insulin Detemir (LEVEMIR FLEXTOUCH) 100 UNIT/ML Pen Inject 25 Units into the skin at bedtime. 09/20/18   Lorin Glass, PA-C  omeprazole (PRILOSEC) 20 MG capsule Take 1 capsule (20 mg total) by mouth daily for 30 days. 09/20/18 03/20/19  Lorin Glass, PA-C  ondansetron (ZOFRAN ODT) 4 MG disintegrating tablet 4mg  ODT q4 hours prn nausea/vomit 03/20/19   Milton Ferguson, MD  warfarin (COUMADIN) 6 MG tablet Take 1 tablet (6 mg total) by mouth at bedtime for 7 days.  09/20/18 09/27/18  Lorin Glass, PA-C    Family History History reviewed. No pertinent family history.  Social History Social History   Tobacco Use  . Smoking status: Current Every Day Smoker    Packs/day: 1.00    Types: Cigarettes  . Smokeless tobacco: Never Used  Substance Use Topics  . Alcohol use: Not on file  . Drug use: Not on file     Allergies   Vicodin [hydrocodone-acetaminophen]   Review of Systems Review of Systems  Constitutional: Negative for chills and fever.  HENT: Negative.   Respiratory: Negative.   Cardiovascular: Negative.   Gastrointestinal: Negative.   Musculoskeletal: Negative.   Skin: Positive for rash.  Neurological: Negative.      Physical Exam Updated Vital Signs BP 133/81 (BP Location: Right Arm)   Pulse (!) 106   Temp 98.1 F (36.7 C) (Oral)   Resp 15   Ht 5\' 10"  (1.778 m)   Wt 74.8 kg   SpO2 98%   BMI 23.68 kg/m   Physical Exam Vitals signs and nursing note reviewed.  Constitutional:      Appearance: He is well-developed.  HENT:     Mouth/Throat:     Mouth: Mucous membranes are moist.     Comments: No intraoral swelling. Neck:     Musculoskeletal: Normal range of motion.  Pulmonary:     Effort: Pulmonary effort is normal.  Musculoskeletal: Normal range of motion.  Skin:    General: Skin is warm and dry.     Comments: Red, raised rash in generalized distribution. No confluence to suggest hives. No blisters, pustules. No infestation observed.   Neurological:     Mental Status: He is alert and oriented to person, place, and time.      ED Treatments / Results  Labs (all labs ordered are listed, but only abnormal results are displayed) Labs Reviewed - No data to display  EKG None  Radiology No results found.  Procedures Procedures (including critical care time)  Medications Ordered in ED Medications  diphenhydrAMINE (BENADRYL) capsule 50 mg (has no administration in time range)  famotidine (PEPCID)  tablet 20 mg (has no administration in time range)  dexamethasone (DECADRON) tablet 10 mg (has no administration in time range)     Initial Impression / Assessment and Plan / ED Course  I have reviewed the triage vital signs and the nursing notes.  Pertinent labs & imaging results that were available during my care of the patient were reviewed by me and considered in my medical decision making (see chart for details).        Patient to ED With itching rash x 2 hours.   Rash does not appear to be hive like in nature. However, with sudden onset, will be treated with benadryl, Pepcid, Decadron as if allergic reaction. No SoB, difficulty swallowing.  1:45 - Rash has persisted despite medications. It now has become raised, confluent, c/w hives. Still no SOB, intraoral swelling.   Will add Atarax. Discussed benign nature of hives and that Decadron has delayed effect but should provide some relief.   3:00 - rash is almost completely resolved. He can be discharged home with appropriate Rx's.   Final Clinical Impressions(s) / ED Diagnoses   Final diagnoses:  None   1. Hives  ED Discharge Orders    None       Elpidio AnisUpstill, Zoeann Mol, PA-C 04/15/19 0158    Elpidio AnisUpstill, Shalisha Clausing, PA-C 04/15/19 0308    Wilkie AyeHorton, Mayer Maskerourtney F, MD 04/15/19 [phone]

## 2019-04-15 NOTE — Discharge Instructions (Signed)
Continue medications as prescribed. Follow up with your doctor for recheck if rash continues. Return to the emergency department with any shortness of breath, severe rash, swelling of your tongue or throat, or for new concern.

## 2019-05-01 ENCOUNTER — Emergency Department (HOSPITAL_COMMUNITY)
Admission: EM | Admit: 2019-05-01 | Discharge: 2019-05-01 | Disposition: A | Discharge status: Home / Self Care | Payer: Medicaid Other | Attending: Emergency Medicine | Admitting: Emergency Medicine

## 2019-05-01 ENCOUNTER — Encounter (HOSPITAL_COMMUNITY): Payer: Self-pay | Admitting: Emergency Medicine

## 2019-05-01 ENCOUNTER — Ambulatory Visit (HOSPITAL_COMMUNITY): Payer: Self-pay | Attending: Emergency Medicine

## 2019-05-01 ENCOUNTER — Other Ambulatory Visit: Payer: Self-pay

## 2019-05-01 DIAGNOSIS — M79632 Pain in left forearm: Secondary | ICD-10-CM | POA: Insufficient documentation

## 2019-05-01 DIAGNOSIS — F17210 Nicotine dependence, cigarettes, uncomplicated: Secondary | ICD-10-CM | POA: Insufficient documentation

## 2019-05-01 DIAGNOSIS — Z7901 Long term (current) use of anticoagulants: Secondary | ICD-10-CM | POA: Insufficient documentation

## 2019-05-01 DIAGNOSIS — M79609 Pain in unspecified limb: Secondary | ICD-10-CM

## 2019-05-01 DIAGNOSIS — E119 Type 2 diabetes mellitus without complications: Secondary | ICD-10-CM | POA: Insufficient documentation

## 2019-05-01 DIAGNOSIS — Z79899 Other long term (current) drug therapy: Secondary | ICD-10-CM | POA: Insufficient documentation

## 2019-05-01 DIAGNOSIS — Z86718 Personal history of other venous thrombosis and embolism: Secondary | ICD-10-CM | POA: Insufficient documentation

## 2019-05-01 DIAGNOSIS — M79602 Pain in left arm: Secondary | ICD-10-CM | POA: Diagnosis present

## 2019-05-01 LAB — BASIC METABOLIC PANEL
Anion gap: 8 (ref 5–15)
BUN: 8 mg/dL (ref 6–20)
CO2: 26 mmol/L (ref 22–32)
Calcium: 9.2 mg/dL (ref 8.9–10.3)
Chloride: 103 mmol/L (ref 98–111)
Creatinine, Ser: 0.69 mg/dL (ref 0.61–1.24)
GFR calc Af Amer: >60 mL/min (ref >60)
GFR calc non Af Amer: >60 mL/min (ref >60)
Glucose, Bld: 151 mg/dL — ABNORMAL HIGH (ref 70–99)
Potassium: 4.4 mmol/L (ref 3.5–5.1)
Sodium: 137 mmol/L (ref 135–145)

## 2019-05-01 LAB — CBC
HCT: 40.8 % (ref 39.0–52.0)
Hemoglobin: 13.3 g/dL (ref 13.0–17.0)
MCH: 27.6 pg (ref 26.0–34.0)
MCHC: 32.6 g/dL (ref 30.0–36.0)
MCV: 84.6 fL (ref 80.0–100.0)
Platelets: 348 10*3/uL (ref 150–400)
RBC: 4.82 MIL/uL (ref 4.22–5.81)
RDW: 13.2 % (ref 11.5–15.5)
WBC: 5.9 10*3/uL (ref 4.0–10.5)
nRBC: 0 % (ref 0.0–0.2)

## 2019-05-01 MED ORDER — FAMOTIDINE 20 MG PO TABS: 20.0000 mg | ORAL_TABLET | Freq: Every day | ORAL | 0 refills | Status: DC

## 2019-05-01 MED ORDER — FAMOTIDINE 20 MG PO TABS: 20.0000 mg | ORAL_TABLET | Freq: Every day | ORAL | 0 refills | Status: AC

## 2019-05-01 NOTE — ED Notes (Signed)
Vasc at bedside.

## 2019-05-01 NOTE — ED Notes (Signed)
Patient states currently experiencing pain level of 9 in LUE, pain level of 8 in BLE usually occurs at night

## 2019-05-01 NOTE — Discharge Instructions (Addendum)
Call family medicine practitioner Monday morning to schedule INR check.  Your INR was 4.1 in the ER today.  Return if symptoms worsen or if you experience shortness of breath, chest pain, dizziness or lightheadedness.

## 2019-05-01 NOTE — ED Provider Notes (Signed)
[facility] EMERGENCY DEPARTMENT Provider Note   CSN: [id number] Arrival date & time: 05/01/19  1216     History   Chief Complaint Chief Complaint  Patient presents with  . Leg Pain    Bilateral  . Arm Pain    Left    HPI ROLLIE HYNEK is a 53 y.o. male. Patient presents with bilateral lower leg pain and left forearm pain that began 2 weeks ago.  Patient states he has a history of diabetes that he does not take medication for.  And history of blood clots in his right arm that were surgically removed 1 year ago.  Patient is currently on warfarin and has been for 7 months.  Was on Xarelto previously.  Patient describes pain as slow onset 2 weeks ago while reclining.  States pain is sharp and episodic worse with movement and laying down.  Patient states pain in right leg radiates into his foot occasionally.  Patient states he had a seizure 2 weeks ago where he fell over and lost consciousness but states it was observed and that he did not hit his head and that his doctor said he was okay.     Patient denies additional symptoms including shortness of breath, chest pain, abdominal pain, weakness.  HPI  Past Medical History:  Diagnosis Date  . Chronic anticoagulation   . Compartment syndrome (HCC)    Right arm, nontraumatic  . Diabetes mellitus without complication ([place])   . DVT (deep venous thrombosis) [facility])     Patient Active Problem List   Diagnosis Date Noted  . Compartment syndrome [facility])     Past Surgical History:  Procedure Laterality Date  . FASCIOTOMY          Home Medications    Prior to Admission medications   Medication Sig Start Date End Date Taking? Authorizing Provider  chlorproMAZINE (THORAZINE) 25 MG tablet Take 1 tablet (25 mg total) by mouth 3 (three) times daily as needed for hiccoughs. Patient not taking: Reported on 04/15/2019 03/20/19   Milton Ferguson, MD  famotidine (PEPCID) 20 MG tablet Take 1 tablet (20 mg total) by mouth 2  (two) times daily. 04/15/19   Charlann Lange, PA-C  hydrOXYzine (ATARAX/VISTARIL) 25 MG tablet Take 1 tablet (25 mg total) by mouth every 6 (six) hours. 04/15/19   Charlann Lange, PA-C  Insulin Detemir (LEVEMIR FLEXTOUCH) 100 UNIT/ML Pen Inject 25 Units into the skin at bedtime. Patient not taking: Reported on 04/15/2019 09/20/18   Lorin Glass, PA-C  omeprazole (PRILOSEC) 20 MG capsule Take 1 capsule (20 mg total) by mouth daily for 30 days. Patient not taking: Reported on 04/15/2019 09/20/18 03/20/19  Lorin Glass, PA-C  ondansetron (ZOFRAN ODT) 4 MG disintegrating tablet 4mg  ODT q4 hours prn nausea/vomit Patient not taking: Reported on 04/15/2019 03/20/19   Milton Ferguson, MD  warfarin (COUMADIN) 6 MG tablet Take 1 tablet (6 mg total) by mouth at bedtime for 7 days. Patient not taking: Reported on 04/15/2019 09/20/18 09/27/18  Lorin Glass, PA-C    Family History No family history on file.  Social History Social History   Tobacco Use  . Smoking status: Current Every Day Smoker    Packs/day: 1.00    Types: Cigarettes  . Smokeless tobacco: Never Used  Substance Use Topics  . Alcohol use: Not on file  . Drug use: Not on file     Allergies   Vicodin [hydrocodone-acetaminophen]   Review of Systems Review of Systems  Constitutional: Negative for chills and fever.  HENT: Negative for congestion, hearing loss, sinus pressure and sore throat.   Eyes:       Left eye cloudy in appearance  Respiratory: Negative for cough and shortness of breath.   Cardiovascular: Negative for chest pain and palpitations.  Gastrointestinal: Negative for abdominal pain, diarrhea, nausea and vomiting.  Genitourinary: Negative for dysuria and hematuria.  Musculoskeletal: Negative for joint swelling.  Skin: Negative for rash.  Neurological: Negative for dizziness and headaches.     Physical Exam Updated Vital Signs BP 139/88   Pulse 76   Temp 98.9 F (37.2 C) (Oral)   Resp 20    Ht 5\' 10"  (1.778 m)   Wt 72.6 kg   SpO2 100%   BMI 22.96 kg/m   Physical Exam Constitutional:      General: He is not in acute distress. HENT:     Head: Normocephalic and atraumatic.     Right Ear: External ear normal.     Left Ear: External ear normal.     Nose: Nose normal.  Neck:     Musculoskeletal: Normal range of motion. No neck rigidity or muscular tenderness.  Cardiovascular:     Rate and Rhythm: Regular rhythm. Tachycardia present.     Pulses: Normal pulses.     Heart sounds: No murmur. No friction rub. No gallop.   Pulmonary:     Effort: Pulmonary effort is normal. No respiratory distress.     Breath sounds: No stridor. No wheezing or rhonchi.  Chest:     Chest wall: No tenderness.  Abdominal:     General: Abdomen is flat.  Musculoskeletal:        General: Tenderness (Left leg tender to palpation for patella tendon inferior to patella) present. No swelling or deformity.     Right lower leg: No edema.     Left lower leg: No edema.     Comments: Full ROM of bilateral lower extremities extremities.  Left upper extremity with soft compartments, no swelling  Skin:    General: Skin is warm and dry.  Neurological:     Mental Status: He is alert. Mental status is at baseline.     Comments: Decreased sensation to touch over the left forearm and thenar eminence.  No sensation loss in lower extremities or right arm. Straight leg raise negative bilateral lower extremities.   Psychiatric:        Mood and Affect: Mood normal.        Behavior: Behavior normal.      ED Treatments / Results  Labs (all labs ordered are listed, but only abnormal results are displayed) Labs Reviewed - No data to display  EKG None  Radiology No results found.  Procedures Procedures (including critical care time)  Medications Ordered in ED Medications - No data to display   Initial Impression / Assessment and Plan / ED Course  I have reviewed the triage vital signs and the nursing  notes.  Pertinent labs & imaging results that were available during my care of the patient were reviewed by me and considered in my medical decision making (see chart for details).     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  Pulse is tachycardic at 105 concerning for pulmonary embolism, but patient denies chest pain, shortness of breath or palpitations.  Right lower extremity  discomfort concerning for cervical spine pathology but unlikely given benign neck exam and lack of tenderness.  Electrolytes, left upper extremity ultrasound, and INR ordered.  Preliminary read of left upper extremity ultrasound is unremarkable per her technician.  INR is supratherapeutic at 4.1 patient states that he has not taken his dose for today yet.  Discussed need for INR check with primary care provider this upcoming week.  Patient will hold Coumadin for the next 2 days and to call for appointment on Monday.  Prescription for Pepcid sent to pharmacy per patient request.  Disposition: Patient's tachycardia resolved during visit.  Patient has mildly elevated blood pressure at time of discharge.  Patient is not demonstrating any symptoms concerning for hypertensive emergency.  All other vitals are reassuring at this time.  Patient states he understands discharge instructions and need for follow-up with PCP.  Return precautions given and patient demonstrates understanding.      Final Clinical Impressions(s) / ED Diagnoses   Final diagnoses:  None    ED Discharge Orders    None       Gailen Shelter, [place] 05/01/19 1916    Jacalyn Lefevre, MD 05/01/19 2325

## 2019-05-01 NOTE — Progress Notes (Signed)
LUE venous duplex       has been completed. Preliminary results can be found under CV proc through chart review. Nelida Mandarino, BS, RDMS, RVT   

## 2019-05-01 NOTE — ED Provider Notes (Addendum)
3:57 PM patient seen in conjunction with Shaleka Brines PA-C.  Patient with history of right upper extremity ischemia and fasciotomy, on chronic Coumadin, presents with 1 to 2-week history of left upper extremity pain.  Pain and numbness are located to the radial aspect of the left forearm.  Patient does not have any weakness or color changes.  Distal sensation in the hand and fingers is intact.  In addition, patient complains of bilateral leg pain for the past week that is intermittent and change with position.  He has reproducible pain and tenderness just below his knees on both sides.  Denies any injuries.  Patient also reports having a seizure while playing dominoes several weeks ago.  It sounds like the patient had full body shaking in a postictal period.  Patient has not followed up with anyone about this.  He has not had any persistent headaches, difficulty walking, or other strokelike symptoms.    Patient's main complaint today is his left forearm pain.  The lower extremity pain is reproducible and seems musculoskeletal in nature.  I have a low suspicion for a cervical cord lesion as patient does not have any neck pain.  He has great strength in his upper and lower extremities bilaterally.  He states compliance with his medications.  Patient's overall exam is reassuring.  Given his history however will check lab work, INR, and a left upper extremity venous Doppler to rule out DVT.  If negative, anticipate discharge to home.  Patient will need to see his primary care doctor for further work-up if it is not improved.   The seizure episode is a bit unusual.  Patient does not report any neuro deficits and does not have any symptoms concerning for bleed.  He has no localizing deficits.  I do not feel that the patient requires further work-up for this at this time however does need to see his primary care doctor.  BP 139/88   Pulse 76   Temp 98.9 F (37.2 C) (Oral)   Resp 20   Ht 5\' 10"  (1.778 m)   Wt 72.6  kg   SpO2 100%   BMI 22.96 kg/m    INR was supratherapeutic. No bleeding. Patient instructed to hold coumadin next 2 doses to allow INR to improve. Call coumadin clinic ASAP.    Carlisle Cater, PA-C 05/01/19 Gonzales, Julie, MD 05/01/19 [place], [place], [place] 05/01/19 [id number]    Isla Pence, MD 05/01/19 2325

## 2019-05-01 NOTE — ED Triage Notes (Signed)
Pt reports bilateral leg pain and left arm pain. Pt reports same has been going on for 2 weeks now.

## 2019-05-02 LAB — PROTIME-INR
INR: 4.1 (ref 0.8–1.2)
Prothrombin Time: 38.7 seconds — ABNORMAL HIGH (ref 11.4–15.2)

## 2019-12-14 ENCOUNTER — Other Ambulatory Visit: Payer: Self-pay

## 2019-12-31 IMAGING — DX DG ABDOMEN ACUTE W/ 1V CHEST
3 series · 3 of 3 positions shown · non-contrast
Comparison: CT of the chest, abdomen, and pelvis 09/20/2018

CLINICAL DATA: Chest pain and abdominal pain. Emesis beginning
yesterday. Chills and fevers.

EXAM:
DG ABDOMEN ACUTE W/ 1V CHEST

[chest ap]
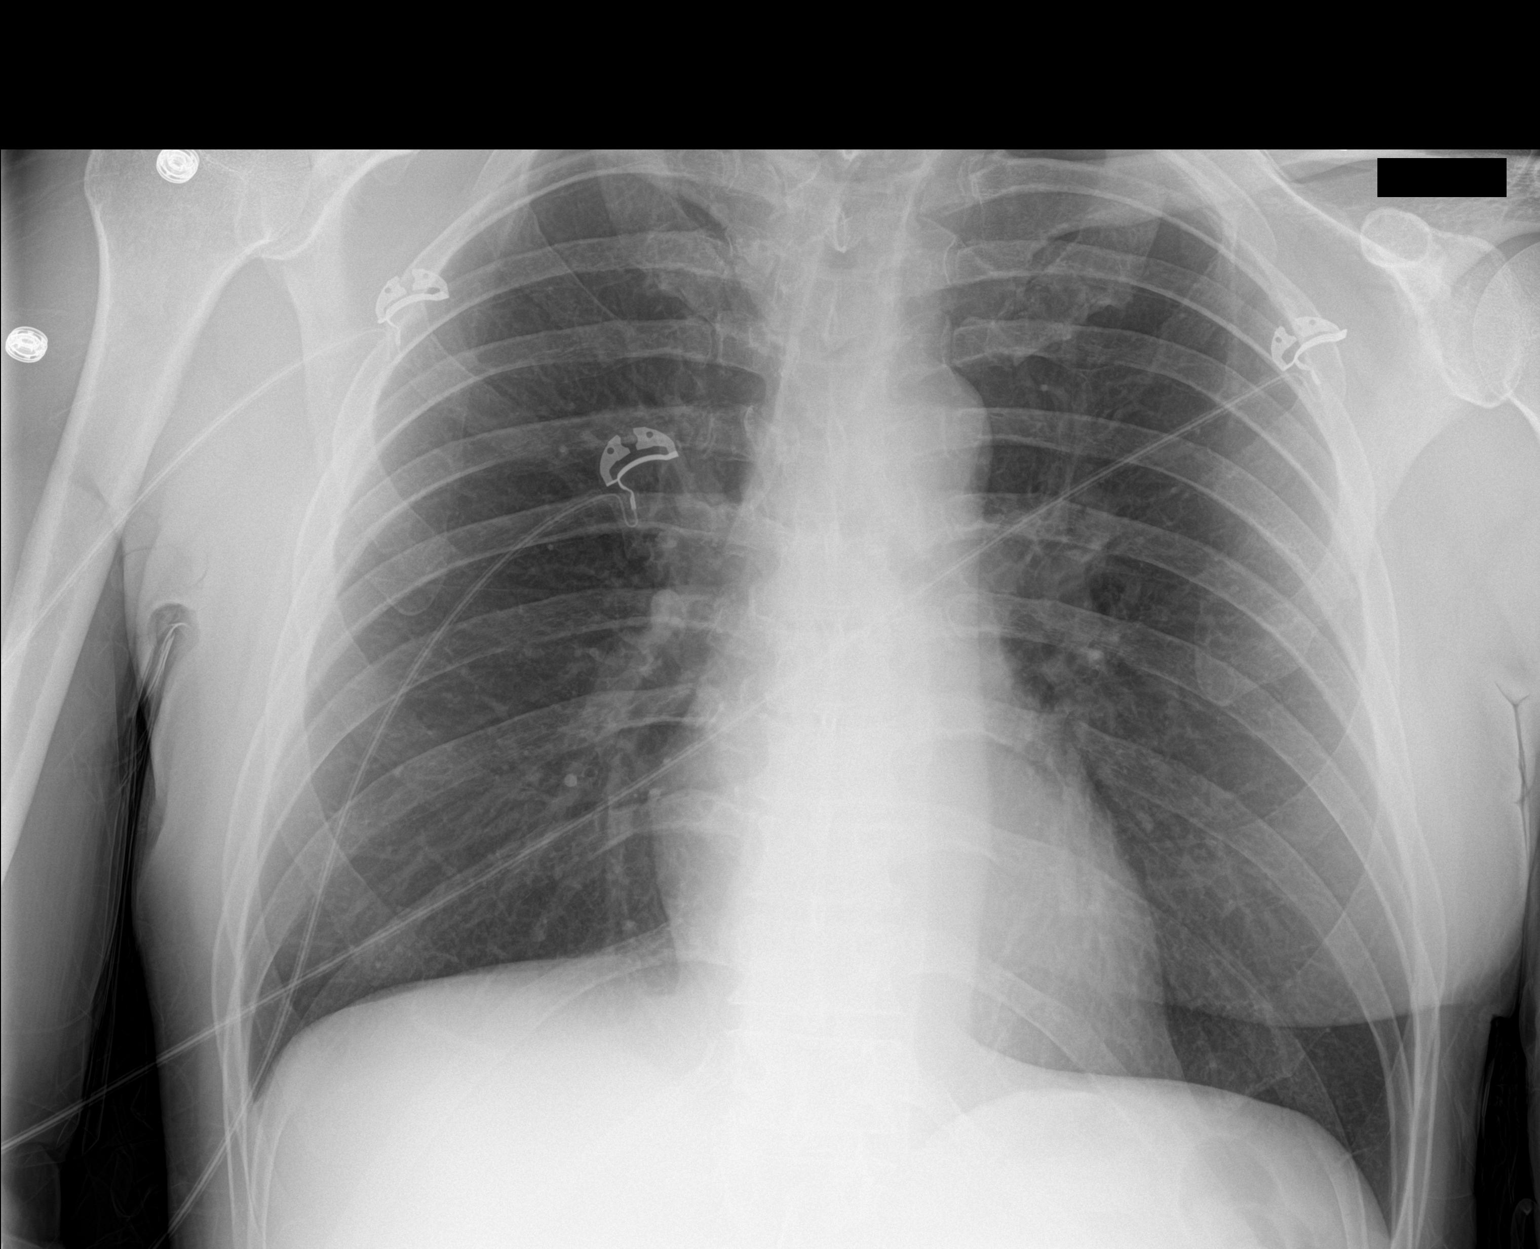

[abdomen kub]
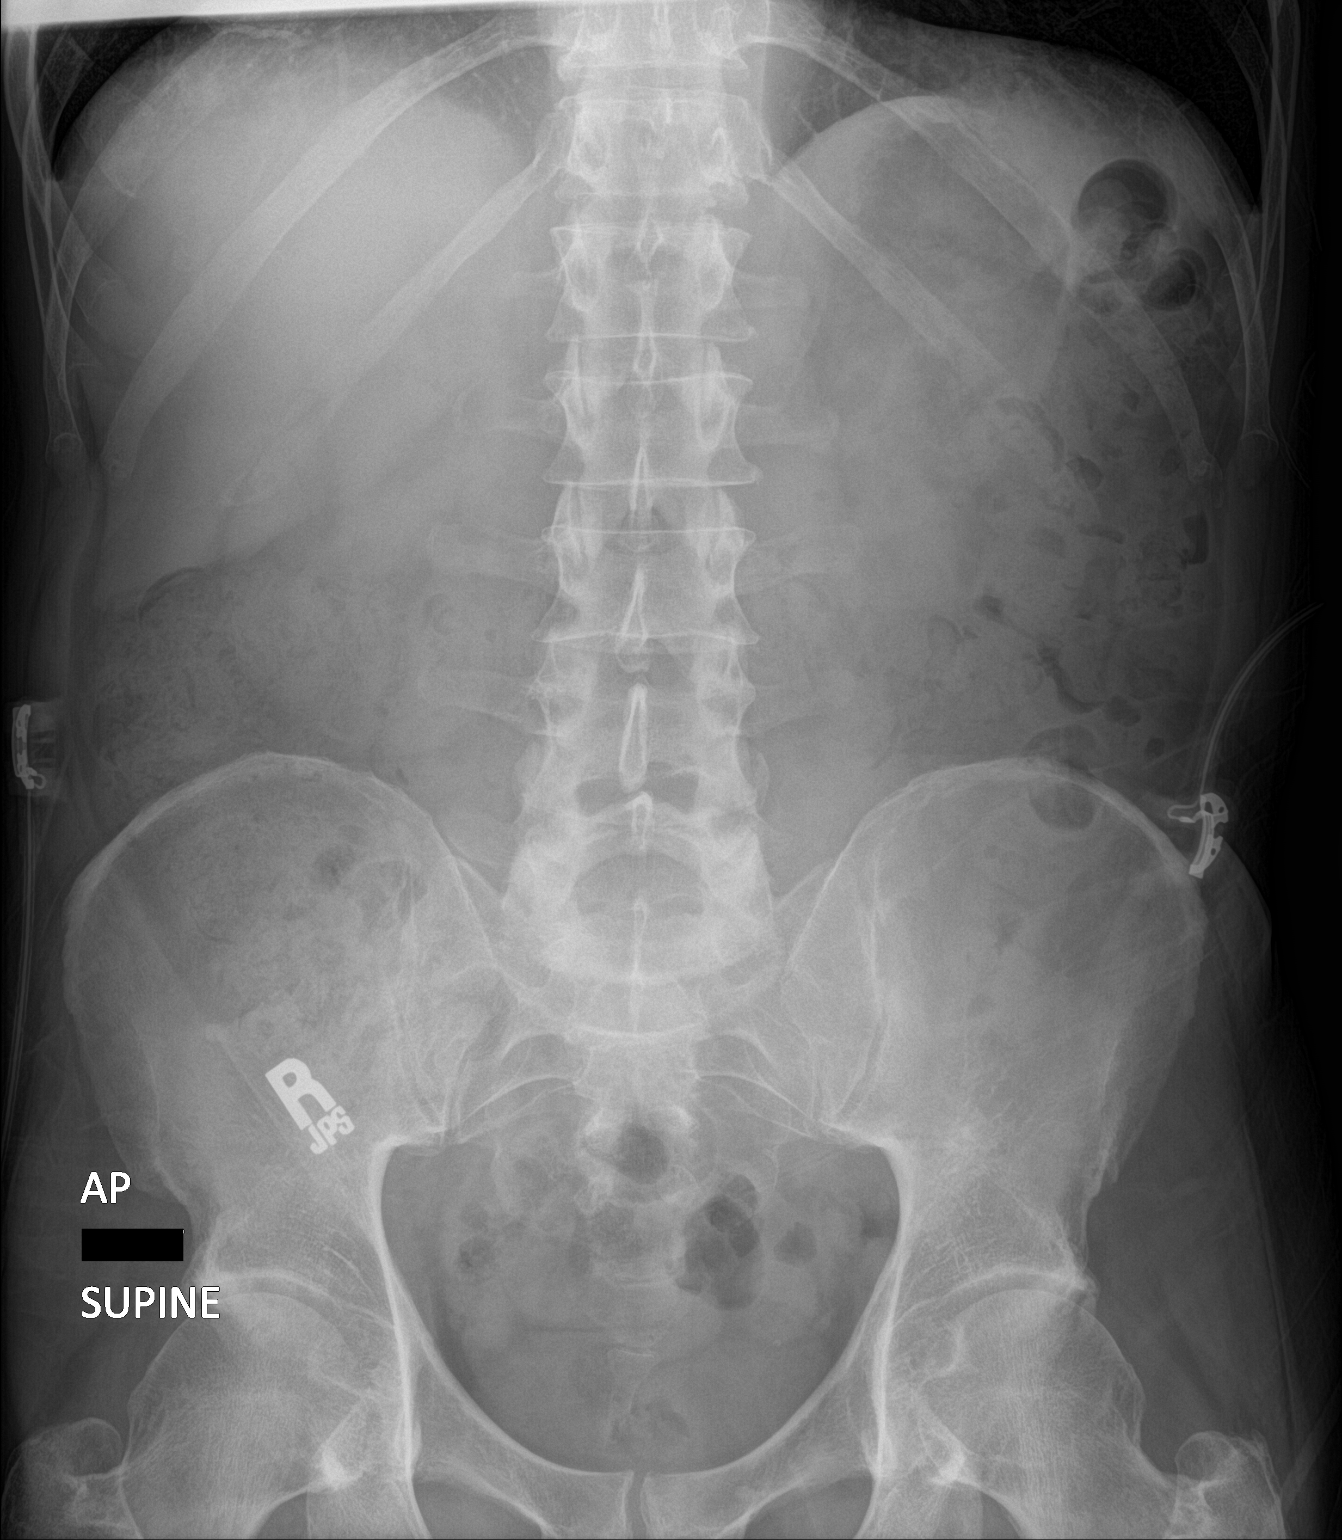

[abdomen decu]
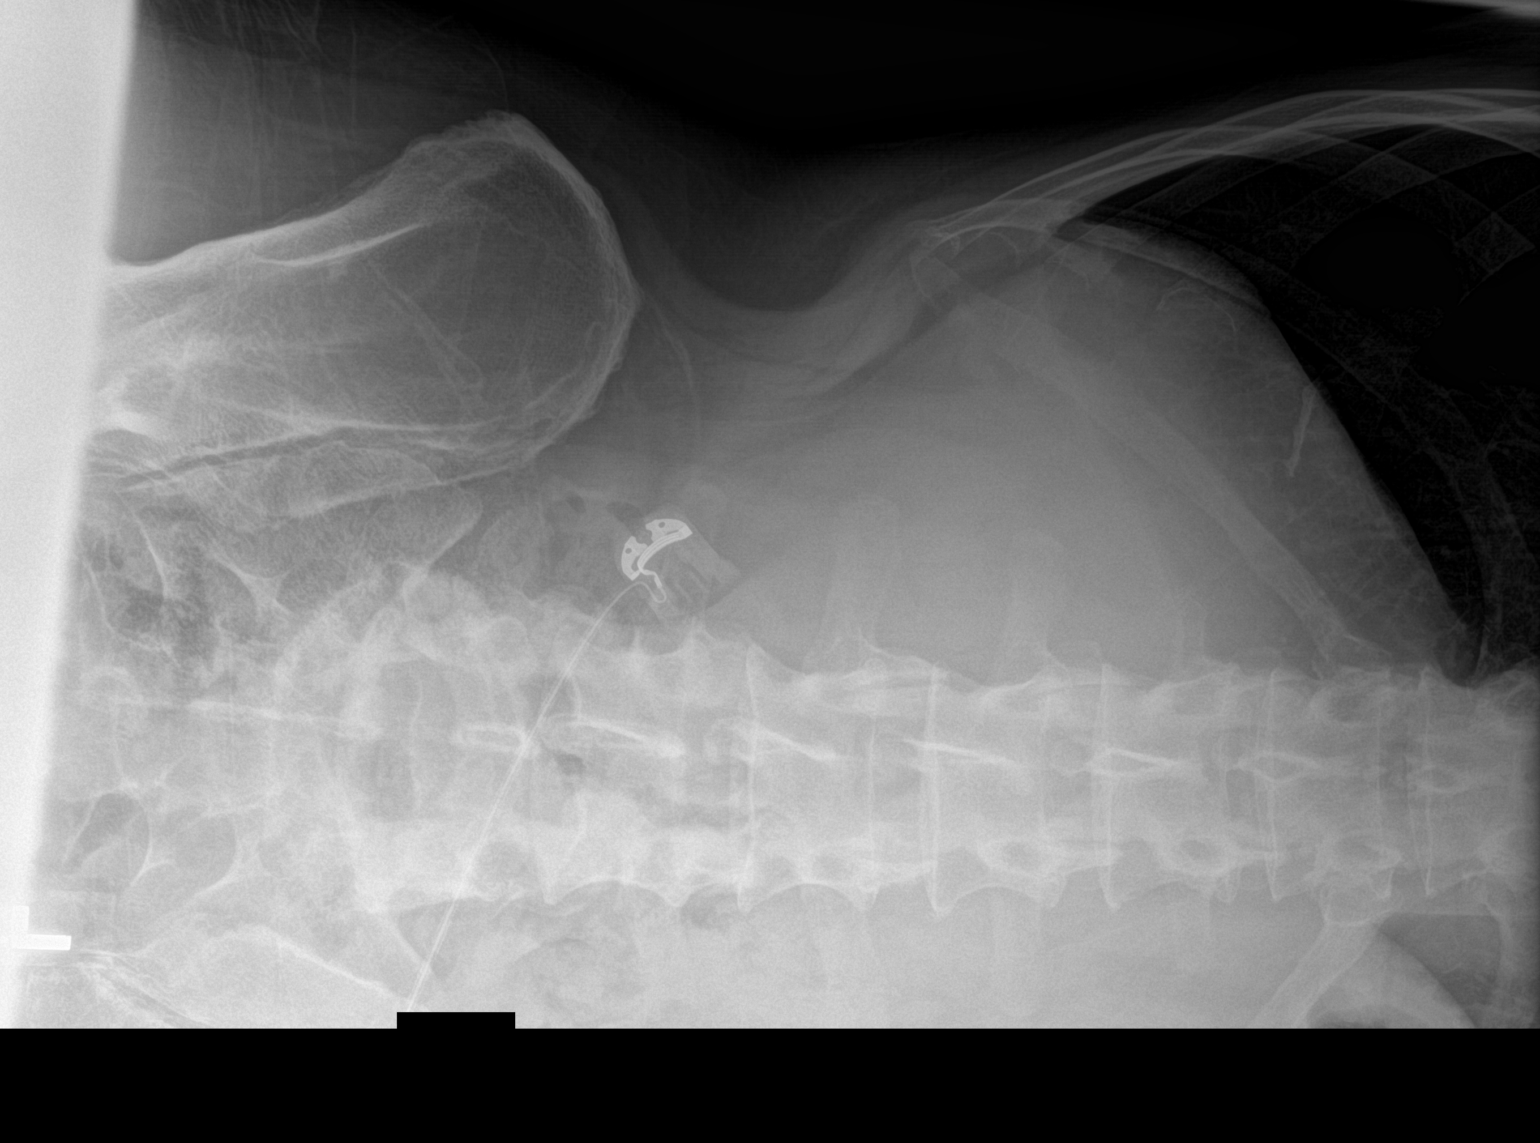

[3 of 3 positions shown; findings below may reference images not displayed]

FINDINGS: There is no evidence of dilated bowel loops or free intraperitoneal
air. No radiopaque calculi or other significant radiographic
abnormality is seen. Heart size and mediastinal contours are within
normal limits. Both lungs are clear.
IMPRESSION: Negative abdominal radiographs.  No acute cardiopulmonary disease.

## 2020-02-04 ENCOUNTER — Other Ambulatory Visit: Payer: Self-pay

## 2020-02-04 ENCOUNTER — Encounter (HOSPITAL_COMMUNITY): Payer: Self-pay | Admitting: Emergency Medicine

## 2020-02-04 ENCOUNTER — Emergency Department (HOSPITAL_COMMUNITY)
Admission: EM | Admit: 2020-02-04 | Discharge: 2020-02-04 | Disposition: A | Discharge status: Home / Self Care | Payer: Self-pay | Attending: Emergency Medicine | Admitting: Emergency Medicine

## 2020-02-04 DIAGNOSIS — R066 Hiccough: Secondary | ICD-10-CM | POA: Insufficient documentation

## 2020-02-04 DIAGNOSIS — Z5321 Procedure and treatment not carried out due to patient leaving prior to being seen by health care provider: Secondary | ICD-10-CM | POA: Insufficient documentation

## 2020-02-04 DIAGNOSIS — R109 Unspecified abdominal pain: Secondary | ICD-10-CM | POA: Insufficient documentation

## 2020-02-04 LAB — CBC
HCT: 40.9 % (ref 39.0–52.0)
Hemoglobin: 13.3 g/dL (ref 13.0–17.0)
MCH: 28.2 pg (ref 26.0–34.0)
MCHC: 32.5 g/dL (ref 30.0–36.0)
MCV: 86.8 fL (ref 80.0–100.0)
Platelets: 352 10*3/uL (ref 150–400)
RBC: 4.71 MIL/uL (ref 4.22–5.81)
RDW: 13.5 % (ref 11.5–15.5)
WBC: 4.7 10*3/uL (ref 4.0–10.5)
nRBC: 0 % (ref 0.0–0.2)

## 2020-02-04 LAB — COMPREHENSIVE METABOLIC PANEL
ALT: 13 U/L (ref 0–44)
AST: 12 U/L — ABNORMAL LOW (ref 15–41)
Albumin: 4.1 g/dL (ref 3.5–5.0)
Alkaline Phosphatase: 54 U/L (ref 38–126)
Anion gap: 6 (ref 5–15)
BUN: 16 mg/dL (ref 6–20)
CO2: 28 mmol/L (ref 22–32)
Calcium: 9.2 mg/dL (ref 8.9–10.3)
Chloride: 107 mmol/L (ref 98–111)
Creatinine, Ser: 0.87 mg/dL (ref 0.61–1.24)
GFR calc Af Amer: >60 mL/min (ref >60)
GFR calc non Af Amer: >60 mL/min (ref >60)
Glucose, Bld: 117 mg/dL — ABNORMAL HIGH (ref 70–99)
Potassium: 4 mmol/L (ref 3.5–5.1)
Sodium: 141 mmol/L (ref 135–145)
Total Bilirubin: 0.6 mg/dL (ref 0.3–1.2)
Total Protein: 6.8 g/dL (ref 6.5–8.1)

## 2020-02-04 LAB — LIPASE, BLOOD: Lipase: 21 U/L (ref 11–51)

## 2020-02-04 MED ORDER — SODIUM CHLORIDE 0.9% FLUSH: 3.0000 mL | Freq: Once | INTRAVENOUS | Status: DC

## 2020-02-04 NOTE — ED Notes (Signed)
Pt given urine cup and aware urine sample is needed 

## 2020-02-04 NOTE — ED Triage Notes (Signed)
Pt reports for about a year having pains-abd and hiccups. Has n/v with it.

## 2020-02-04 NOTE — ED Notes (Signed)
No answer from lobby  

## 2020-03-30 ENCOUNTER — Other Ambulatory Visit: Payer: Self-pay | Admitting: *Deleted

## 2020-03-30 ENCOUNTER — Encounter: Payer: Self-pay | Admitting: *Deleted

## 2020-03-30 NOTE — Patient Outreach (Signed)
[facility]) Care Management [facility] CM Telephone Outreach- insurance referral/ new patient Post-hospital discharge day # 8 Unsuccessful (consecutive) outreach attempt # 1- new patient referral   03/30/2020  Phillip Avila 04-26-1966 [id number]  Unsuccessful initial attempt telephone outreach to [name], 54 y/o male referred to [facility] RN CM 03/29/20 by [facility] CMA on health insurance plan referral for multiple recent ED visits.  Patient has had 5 ED visits over last 12 months; most recently hospitalized July 12-20, 2021 for persistent dysphagia due to esophageal stricture; rectal bleeding/ constipation; and AKI secondary to dehydration.  During hospitalization, patient had EGD with esophageal dilation.  Patient was discharged home to self care without home health services in place; noted through review of EHR that patient is homeless and currently residing at [facility].    Patient has history including, but not limited to, compartment syndrome, (L) arm pain; DM- Type II (A1-C 7.6 on 03/22/20); and ongoing tobacco use.  HIPAA compliant voice mail message left for patient, requesting return call back.  Plan:  Will place [facility] CM unsuccessful patient outreach letter in mail requesting call back in writing  Will re-attempt [facility] CM telephone outreach within 4 business days if I do not hear back from patient first  Caryl Pina, RN, BSN, [employer] Coordinator [facility] Care Management  [phone]

## 2020-04-01 ENCOUNTER — Encounter: Payer: Self-pay | Admitting: *Deleted

## 2020-04-01 ENCOUNTER — Other Ambulatory Visit: Payer: Self-pay | Admitting: *Deleted

## 2020-04-01 NOTE — Patient Outreach (Signed)
[facility] Customer service manager [facility]) Care Management [facility] CM Telephone Outreach, insurance referral, new patient Post-hospital discharge day # 10  04/01/2020  Phillip Avila 09/16/1965 [id number]  Successful second attempt telephone outreach to Phillip Avila, 55 y/o male referred to [facility] RN CM 03/29/20 by [facility] CMA on health insurance plan referral for multiple recent ED visits.  Patient has had 5 ED visits over last 12 months; most recently hospitalized July 12-20, 2021 for persistent dysphagia due to esophageal stricture; rectal bleeding/ constipation; and AKI secondary to dehydration.  During hospitalization, patient had EGD with esophageal dilation.  Patient was discharged home to self care without home health services in place; noted through review of EHR that patient is homeless and currently residing at [facility].    Patient has history including, but not limited to, compartment syndrome, (L) arm pain; DM- Type II (A1-C 7.6 on 03/22/20); and ongoing tobacco use.  With call today, patient answered phone and provided HIPAA identifiers for name and dob; attempted to discuss purpose of call with patient and to explain that I was calling on insurance referral to see if [facility] CM program could assist in his overall management of carepost- recent ED and hospital visits; patient abruptly, stated "no, you can't help me; please do not call me again."  He then hung the phone up without allowing further response form me.  Plan:  Verified [facility] CM unsuccessful patient outreach letter in mail requesting call back in writing on 03/30/20  Will make patient inactive with [facility] CM, as he has declined services/ further outreach from [facility] CM team  Caryl Pina, RN, BSN, CCRN [employer] [facility] Coordinator [facility] Care Management  [phone]

## 2020-04-05 ENCOUNTER — Ambulatory Visit: Payer: Self-pay | Admitting: *Deleted

## 2020-04-07 ENCOUNTER — Other Ambulatory Visit: Payer: Self-pay | Admitting: *Deleted

## 2020-04-07 NOTE — Patient Outreach (Signed)
[facility]) Care Management  04/07/2020  [name] [birth year] [id number]  [facility] CM Multidisciplinary Case Conference re:  Phillip Avila, 54 y/o male referred to [facility] RN CM 03/29/20 by [facility] CMA on health insurance plan referral for multiple recent ED visits. Patient has had 5 ED visits over last 12 months; most recently hospitalized July 12-20, 2021 for persistent dysphagia due to esophageal stricture; rectal bleeding/ constipation; and AKI secondary to dehydration. During hospitalization, patient had EGD with esophageal dilation.  Patient was discharged home to self care without home health services in place; noted through review of EHR that patient is homeless and currently residing at [facility].  Patient has history including, but not limited to, compartment syndrome, (L) arm pain; DM- Type II (A1-C 7.6 on 03/22/20); and ongoing tobacco use.  [facility] CM Team Discussion:  -- patient adamantly declined services when outreached by [facility] RN CM -- has not visited PCP in many years, according to review if EHR- goes to ED; tends to chose [facility] facilities -- lives in homeless shelter -- recently cancelled procedure for esophageal dilatation due to patient's selected facility to receive procedure: insurance would not cover (out of network)  Plan:  Keep patient inactive with [facility] CM; insurance provider to reach out internally  Caryl Pina, RN, BSN, [employer] Coordinator [facility] Care Management  [phone]
